# Patient Record
Sex: Female | Born: 1970 | Race: White | Hispanic: No | Marital: Single | Smoking: Former smoker
Health system: Southern US, Community
[De-identification: ages and names within clinical notes are randomized; demographics above are authoritative.]

## PROBLEM LIST (undated history)

## (undated) DIAGNOSIS — K602 Anal fissure, unspecified: Secondary | ICD-10-CM

## (undated) DIAGNOSIS — N920 Excessive and frequent menstruation with regular cycle: Secondary | ICD-10-CM

## (undated) DIAGNOSIS — N83209 Unspecified ovarian cyst, unspecified side: Secondary | ICD-10-CM

## (undated) HISTORY — DX: Anal fissure, unspecified: K60.2

## (undated) HISTORY — DX: Unspecified ovarian cyst, unspecified side: N83.209

## (undated) HISTORY — DX: Excessive and frequent menstruation with regular cycle: N92.0

---

## 1998-06-06 HISTORY — PX: AUGMENTATION MAMMAPLASTY: SUR837

## 2001-06-06 HISTORY — PX: ABDOMINAL HYSTERECTOMY: SHX81

## 2013-11-08 ENCOUNTER — Ambulatory Visit: Payer: Self-pay | Admitting: Obstetrics and Gynecology

## 2013-11-13 ENCOUNTER — Ambulatory Visit: Payer: Self-pay | Admitting: Obstetrics and Gynecology

## 2017-12-15 ENCOUNTER — Ambulatory Visit: Payer: Self-pay | Admitting: Obstetrics and Gynecology

## 2017-12-15 ENCOUNTER — Encounter: Payer: Self-pay | Admitting: Obstetrics and Gynecology

## 2017-12-15 VITALS — BP 102/69 | HR 97 | Ht 63.0 in | Wt 162.1 lb

## 2017-12-15 DIAGNOSIS — K649 Unspecified hemorrhoids: Secondary | ICD-10-CM

## 2017-12-15 MED ORDER — HYDROCORTISONE ACETATE 25 MG RE SUPP: 25.0000 mg | Freq: Two times a day (BID) | RECTAL | 0 refills | Status: DC

## 2017-12-15 NOTE — Progress Notes (Signed)
HPI:      [name] isHorald Ellis a 47 y.o. G1P1001 who LMP was No LMP recorded. Patient has had a hysterectomy.  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  She states that last night she had severe pain around her anus that she could not resolve.  This morning she reports that it is significantly improved.  She is not sure if it is an abscess or bad fissure or hemorrhoid. She does report a history of previously thrombosed hemorrhoids as well as anal fissures which she treats regularly keeping her stool soft, using sitz bath's and a variety of topical anesthetics.    Hx: The following portions of the patient's history were reviewed and updated as appropriate:             She  has a past medical history of Anal fissure, Heavy periods, and Ovarian cyst. She does not have a problem list on file. She  has a past surgical history that includes Abdominal hysterectomy (2003). Her family history includes Gallbladder disease in her sister. She  reports that she has been smoking.  She has been smoking about 0.50 packs per day. She has never used smokeless tobacco. She reports that she drinks alcohol. She reports that she has current or past drug history. She has a current medication list which includes the following prescription(s): acetaminophen, ibuprofen, lidocaine, and hydrocortisone. She has No Known Allergies.       Review of Systems:  Review of Systems  Constitutional: Denied constitutional symptoms, night sweats, recent illness, fatigue, fever, insomnia and weight loss.  Eyes: Denied eye symptoms, eye pain, photophobia, vision change and visual disturbance.  Ears/Nose/Throat/Neck: Denied ear, nose, throat or neck symptoms, hearing loss, nasal discharge, sinus congestion and sore throat.  Cardiovascular: Denied cardiovascular symptoms, arrhythmia, chest pain/pressure, edema, exercise intolerance, orthopnea and palpitations.  Respiratory: Denied  pulmonary symptoms, asthma, pleuritic pain, productive sputum, cough, dyspnea and wheezing.  Gastrointestinal: Denied, gastro-esophageal reflux, melena, nausea and vomiting.  Genitourinary: See HPI for additional information.  Musculoskeletal: Denied musculoskeletal symptoms, stiffness, swelling, muscle weakness and myalgia.  Dermatologic: Denied dermatology symptoms, rash and scar.  Neurologic: Denied neurology symptoms, dizziness, headache, neck pain and syncope.  Psychiatric: Denied psychiatric symptoms, anxiety and depression.  Endocrine: Denied endocrine symptoms including hot flashes and night sweats.   Meds:   Current Outpatient Medications on File Prior to Visit  Medication Sig Dispense Refill  . acetaminophen (TYLENOL) 325 MG tablet Take 650 mg by mouth every 6 (six) hours as needed.    Marland Kitchen. ibuprofen (ADVIL,MOTRIN) 600 MG tablet Take by mouth.    . lidocaine (XYLOCAINE) 5 % ointment Apply 1 application topically as needed.     No current facility-administered medications on file prior to visit.     Objective:     Vitals:   12/15/17 1034  BP: 102/69  Pulse: 97              Examination of the anus and anal verge reveals 2 hemorrhoidal tags.  These are not tense.  They are nontender.  There is no erythema consistent with infection.  No anal fissures could be detected.  Assessment:    G1P1001 There are no active problems to display for this patient.    1. Hemorrhoids, unspecified hemorrhoid type     Nonthrombosed and much improved today versus yesterday.  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   Plan:            1.  Anusol HC suppositories prescribed in case she has a flare this weekend.  2.  Refer to GI for further evaluation and possible definitive treatment. Orders Orders Placed This Encounter  Procedures  . Ambulatory referral to Gastroenterology     Meds ordered this  encounter  Medications  . hydrocortisone (ANUSOL-HC) 25 MG suppository    Sig: Place 1 suppository (25 mg total) rectally 2 (two) times daily.    Dispense:  12 suppository    Refill:  0      F/U  No follow-ups on file. I spent 25 minutes involved in the care of this patient of which greater than 50% was spent discussing history of hemorrhoids fissures etc. going back to the 90s and previous childbirth.  Discussion of current condition and strategies that she has used to try to manage the pain.  Discussed use of suppositories and possible referral to GI.  All questions answered.  Elonda Husky, M.D. 12/15/2017 12:38 PM

## 2018-01-10 ENCOUNTER — Ambulatory Visit: Payer: Self-pay | Admitting: Gastroenterology

## 2019-08-20 ENCOUNTER — Other Ambulatory Visit: Payer: Self-pay | Admitting: Family Medicine

## 2019-08-20 DIAGNOSIS — Z1231 Encounter for screening mammogram for malignant neoplasm of breast: Secondary | ICD-10-CM

## 2019-08-26 ENCOUNTER — Ambulatory Visit: Payer: 59 | Admitting: Nurse Practitioner

## 2019-09-18 ENCOUNTER — Other Ambulatory Visit: Payer: Self-pay | Admitting: Family Medicine

## 2019-09-18 ENCOUNTER — Ambulatory Visit
Admission: RE | Admit: 2019-09-18 | Discharge: 2019-09-18 | Disposition: A | Discharge status: Home / Self Care | Payer: 59 | Source: Ambulatory Visit | Attending: Family Medicine | Admitting: Family Medicine

## 2019-09-18 DIAGNOSIS — Z1231 Encounter for screening mammogram for malignant neoplasm of breast: Secondary | ICD-10-CM

## 2020-08-20 ENCOUNTER — Other Ambulatory Visit: Payer: Self-pay | Admitting: Family Medicine

## 2020-08-20 DIAGNOSIS — Z1231 Encounter for screening mammogram for malignant neoplasm of breast: Secondary | ICD-10-CM

## 2020-11-12 ENCOUNTER — Ambulatory Visit
Admission: RE | Admit: 2020-11-12 | Discharge: 2020-11-12 | Disposition: A | Discharge status: Home / Self Care | Payer: 59 | Source: Ambulatory Visit | Attending: Family Medicine | Admitting: Family Medicine

## 2020-11-12 DIAGNOSIS — Z1231 Encounter for screening mammogram for malignant neoplasm of breast: Secondary | ICD-10-CM | POA: Diagnosis not present

## 2021-09-20 IMAGING — MG DIGITAL SCREENING BREAST BILAT IMPLANT W/ TOMO W/ CAD
9 of 14 series · 9 of 34 positions shown · non-contrast
Comparison: Previous exam(s).

CLINICAL DATA: Screening.

EXAM:
DIGITAL SCREENING BILATERAL MAMMOGRAM WITH IMPLANTS, CAD AND
TOMOSYNTHESIS
TECHNIQUE: Bilateral screening digital craniocaudal and mediolateral oblique
mammograms were obtained. Bilateral screening digital breast
tomosynthesis was performed. The images were evaluated with
computer-aided detection. Standard and/or implant displaced views
were performed.

[L CC]
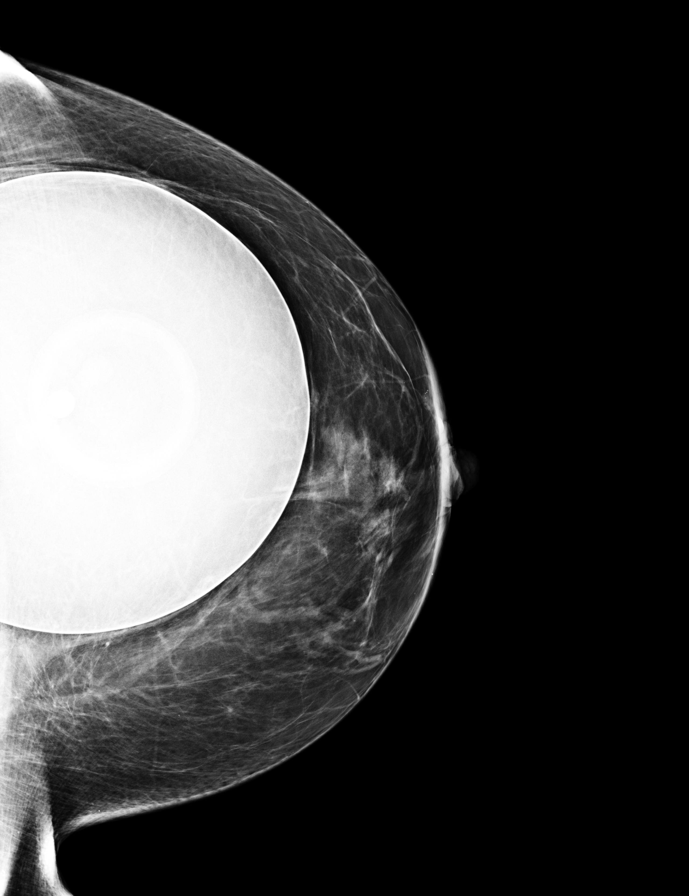

[L MLO]
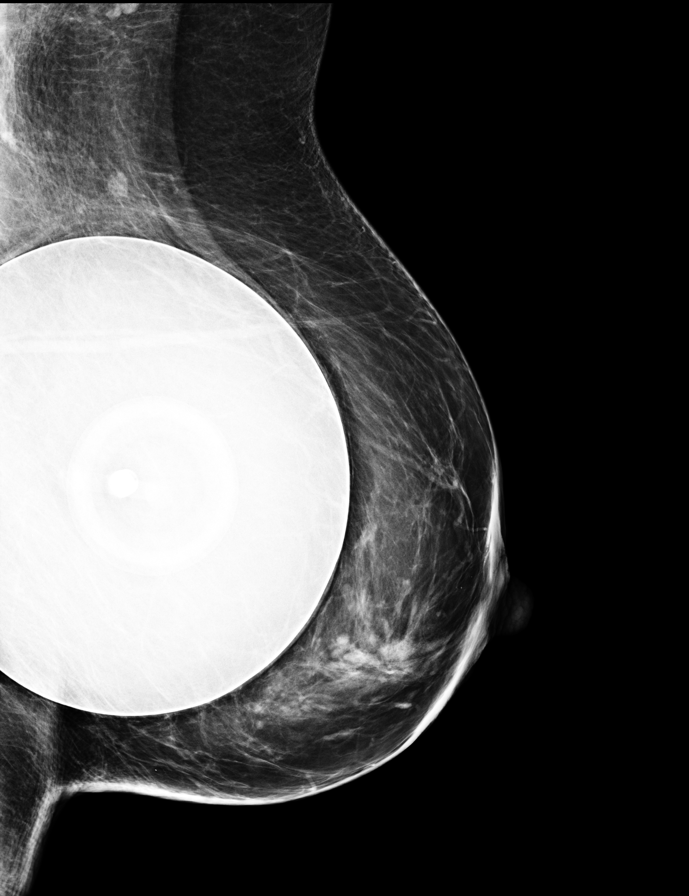

[R MLO]
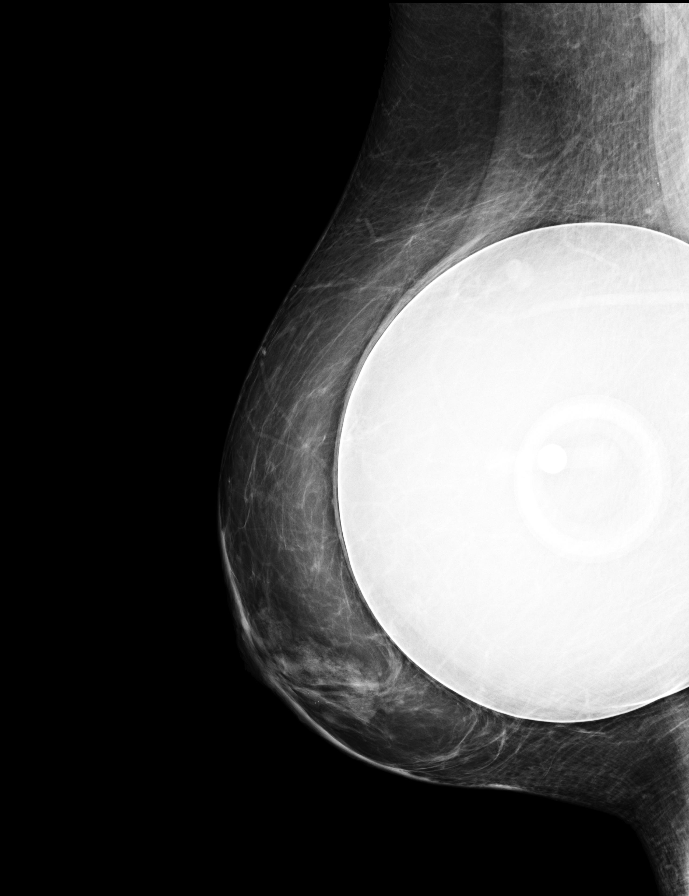

[R CC]
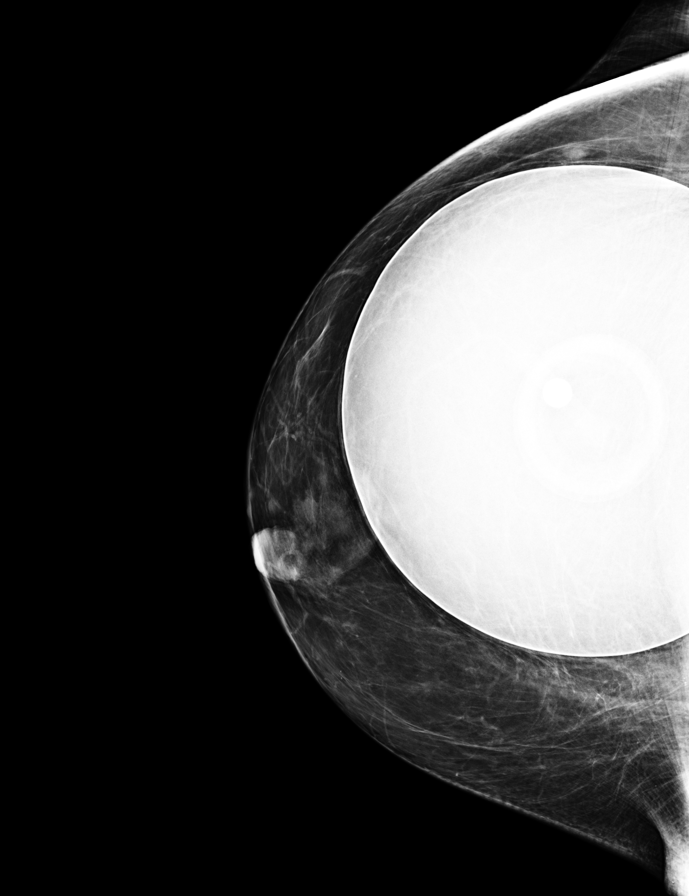

[R CC synth-2D]
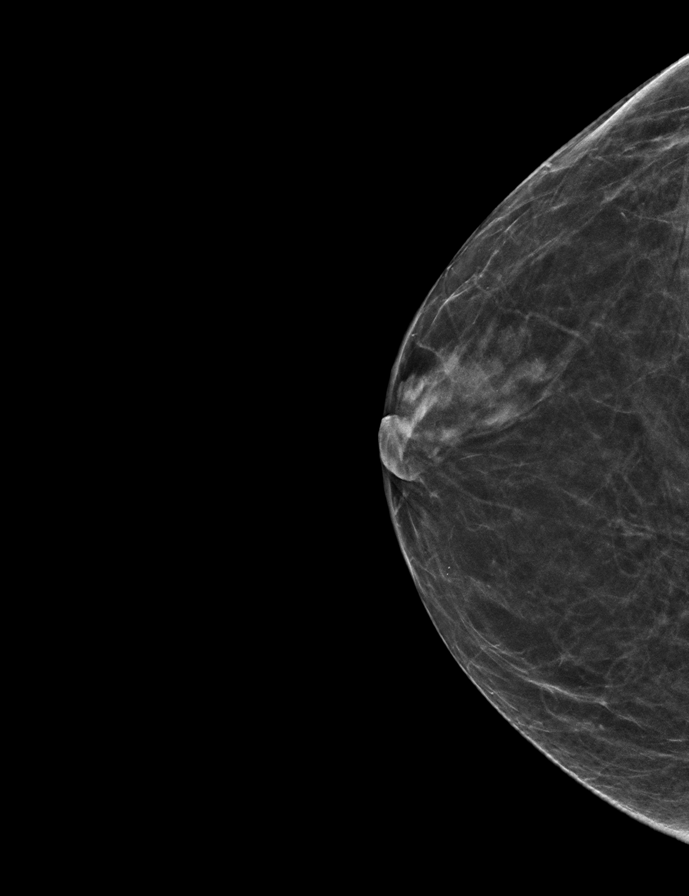

[L MLO synth-2D (1 of 2)]
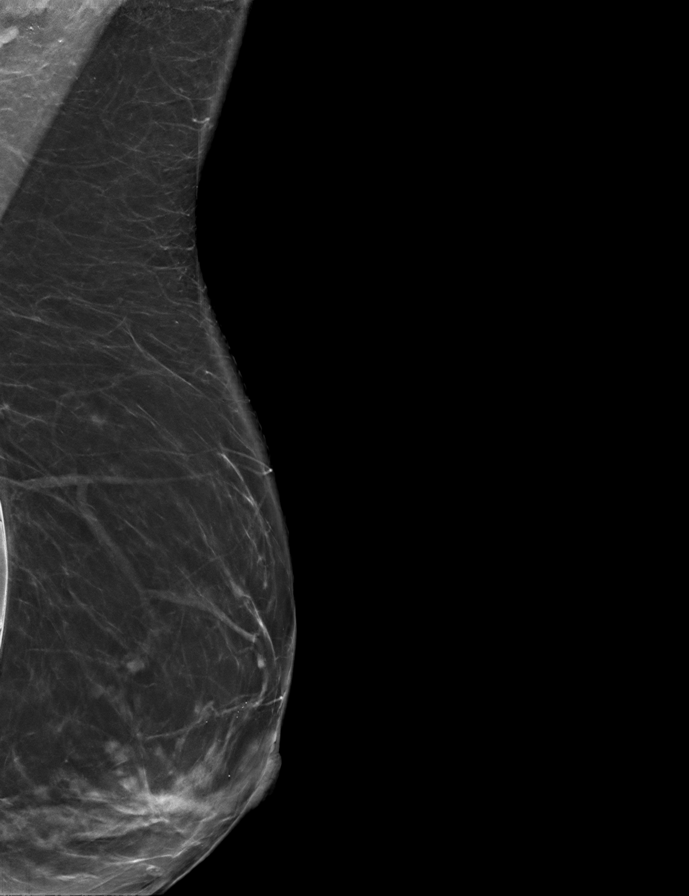

[L CC synth-2D]
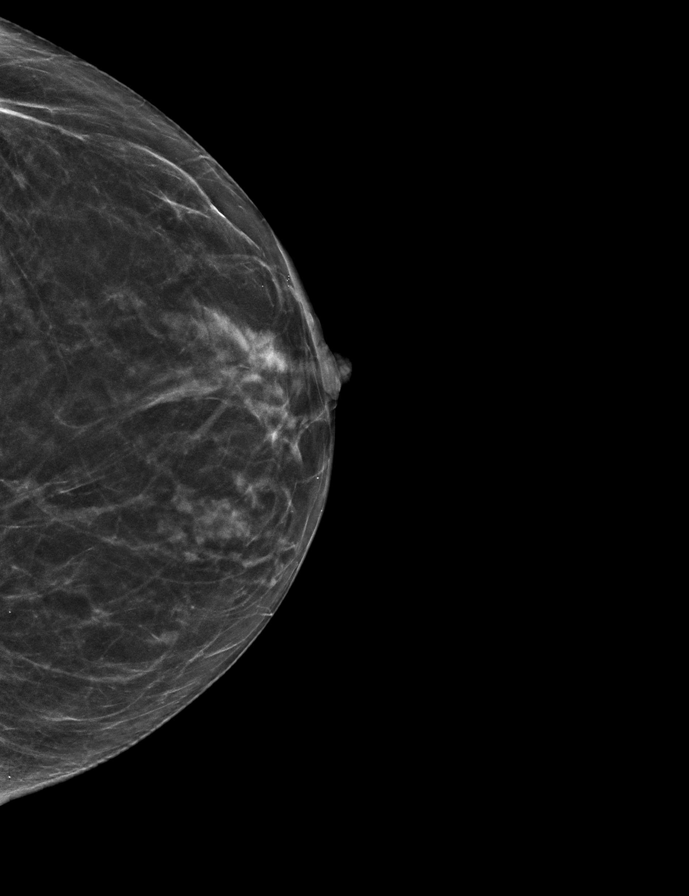

[R MLO synth-2D]
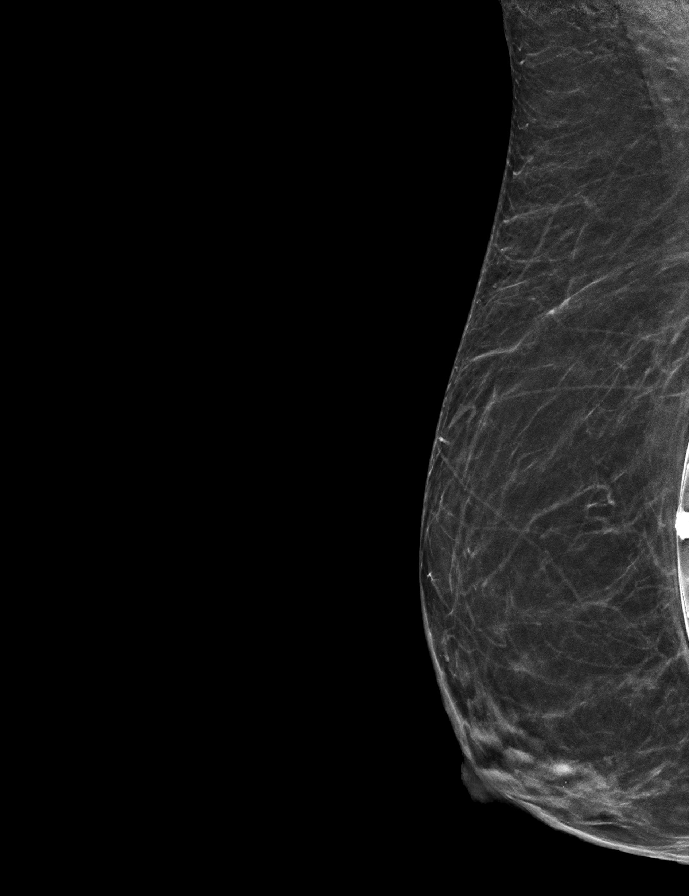

[L MLO synth-2D (2 of 2)]
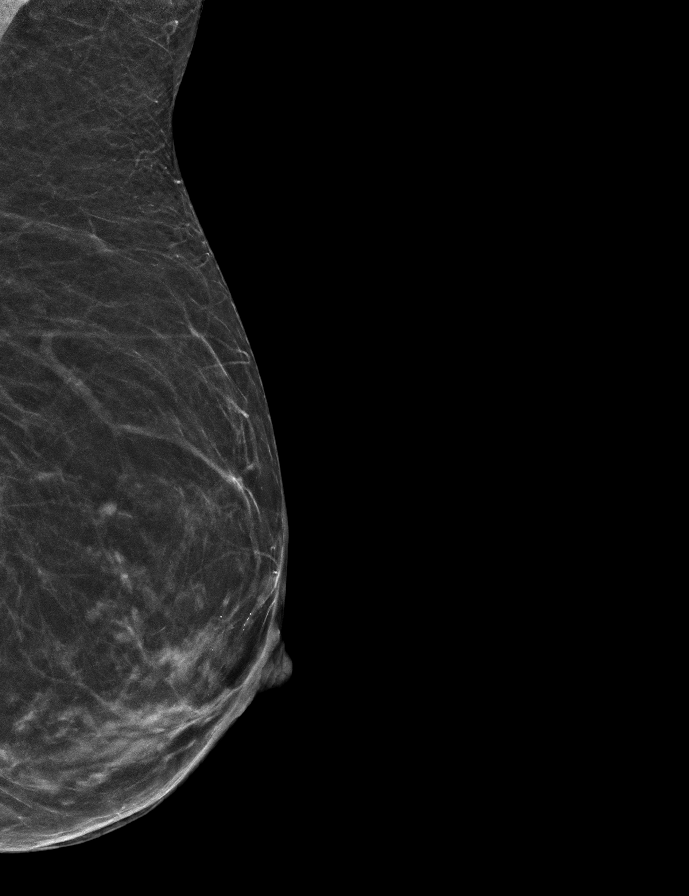

[9 of 34 positions shown; findings below may reference images not displayed]

ACR Breast Density Category b: There are scattered areas of
fibroglandular density.
FINDINGS: The patient has retropectoral implants. There are no findings
suspicious for malignancy.
IMPRESSION: No mammographic evidence of malignancy. A result letter of this
screening mammogram will be mailed directly to the patient.

RECOMMENDATION:
Screening mammogram in one year. (Code:SE-S-JMG)

BI-RADS CATEGORY  1:  Negative.

## 2022-06-15 ENCOUNTER — Other Ambulatory Visit (INDEPENDENT_AMBULATORY_CARE_PROVIDER_SITE_OTHER): Payer: Self-pay | Admitting: Vascular Surgery

## 2022-06-15 DIAGNOSIS — G4484 Primary exertional headache: Secondary | ICD-10-CM

## 2022-06-17 ENCOUNTER — Ambulatory Visit (INDEPENDENT_AMBULATORY_CARE_PROVIDER_SITE_OTHER): Payer: Commercial Managed Care - PPO | Admitting: Vascular Surgery

## 2022-06-17 ENCOUNTER — Encounter (INDEPENDENT_AMBULATORY_CARE_PROVIDER_SITE_OTHER): Payer: Self-pay | Admitting: Vascular Surgery

## 2022-06-17 ENCOUNTER — Ambulatory Visit (INDEPENDENT_AMBULATORY_CARE_PROVIDER_SITE_OTHER): Payer: Commercial Managed Care - PPO

## 2022-06-17 VITALS — BP 117/82 | HR 65 | Ht 63.0 in | Wt 162.0 lb

## 2022-06-17 DIAGNOSIS — G4484 Primary exertional headache: Secondary | ICD-10-CM

## 2022-06-17 DIAGNOSIS — F17200 Nicotine dependence, unspecified, uncomplicated: Secondary | ICD-10-CM | POA: Diagnosis not present

## 2022-06-17 DIAGNOSIS — I6521 Occlusion and stenosis of right carotid artery: Secondary | ICD-10-CM

## 2022-06-17 DIAGNOSIS — I6529 Occlusion and stenosis of unspecified carotid artery: Secondary | ICD-10-CM | POA: Insufficient documentation

## 2022-06-17 NOTE — Assessment & Plan Note (Signed)
Patient says she recently quit and was congratulated on smoking cessation.

## 2022-06-17 NOTE — Assessment & Plan Note (Signed)
A carotid duplex was performed today which showed plaque in the right carotid artery with disease that would fall in the 1 to 39% range although likely the lower end of that range.  No significant left carotid disease was seen.  Both vertebral arteries were antegrade. This was unlikely the cause of her recent symptoms.  No intervention is required for this.  This can be checked every 2 years or so with noninvasive studies at this point.

## 2022-06-17 NOTE — Progress Notes (Signed)
Patient ID: Eileen Ellis, female   DOB: May 02, 1971, 52 y.o.   MRN: [id number]  Chief Complaint  Patient presents with   New Patient (Initial Visit)    np. consult. 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 recent carotid in epic. referred by shah, hemang      HPI Eileen Ellis is a 52 y.o. female.  I am asked to see the patient by Lenise Arena for evaluation of headaches, tachycardia, and presyncope.  There was concern for possible carotid disease in a patient with a long history of tobacco use who just quit last month.  She did not have focal arm or leg weakness or numbness, speech or swallowing difficulty, or temporary monocular blindness.  A carotid duplex was performed today which showed plaque in the right carotid artery with disease that would fall in the 1 to 39% range although likely the lower end of that range.  No significant left carotid disease was seen.  Both vertebral arteries were antegrade.   Past Medical History:  Diagnosis Date   Anal fissure    Heavy periods    Ovarian cyst     Past Surgical History:  Procedure Laterality Date   ABDOMINAL HYSTERECTOMY  2003   Dr. Davis Gourd   AUGMENTATION MAMMAPLASTY Bilateral 2000     Family History  Problem Relation Age of Onset   Gallbladder disease Sister    Breast cancer Neg Hx    Ovarian cancer Neg Hx    Colon cancer Neg Hx    Diabetes Neg Hx       Social History   Tobacco Use   Smoking status: Former    Packs/day: 0.50    Types: Cigarettes    Start date: 06/07/1991    Quit date: 05/08/2022    Years since quitting: 0.1   Smokeless tobacco: Never  Vaping Use   Vaping Use: Never used  Substance Use Topics   Alcohol use: Yes    Comment: occas   Drug use: Not Currently  Patient says she stopped smoking about a month ago.   No Known Allergies  Current Outpatient Medications  Medication Sig Dispense Refill   BLACK CURRANT SEED OIL PO Take by mouth.     Multiple Vitamins-Minerals (ZINC  PO) Take by mouth.     NON FORMULARY Thyroxal     NON FORMULARY QUERCTIN C     Omega-3 Fatty Acids (FISH OIL PO) Take by mouth.     buPROPion (WELLBUTRIN SR) 150 MG 12 hr tablet Take 150 mg by mouth 2 (two) times daily. (Patient not taking: Reported on 06/17/2022)     No current facility-administered medications for this visit.      REVIEW OF SYSTEMS (Negative unless checked)  Constitutional: [] Weight loss  [] Fever  [] Chills Cardiac: [] Chest pain   [] Chest pressure   [x] Palpitations   [] Shortness of breath when laying flat   [] Shortness of breath at rest   [] Shortness of breath with exertion. Vascular:  [] Pain in legs with walking   [] Pain in legs at rest   [] Pain in legs when laying flat   [] Claudication   [] Pain in feet when walking  [] Pain in feet at rest  [] Pain in feet when laying flat   [] History of DVT   [] Phlebitis   [] Swelling in legs   [] Varicose veins   [] Non-healing ulcers Pulmonary:   [] Uses home oxygen   [] Productive cough   [] Hemoptysis   [] Wheeze  [] COPD   [] Asthma Neurologic:  [] Dizziness  []   Blackouts   [] Seizures   [] History of stroke   [] History of TIA  [] Aphasia   [] Temporary blindness   [] Dysphagia   [] Weakness or numbness in arms   [] Weakness or numbness in legs X headache Musculoskeletal:  [] Arthritis   [] Joint swelling   [] Joint pain   [] Low back pain Hematologic:  [] Easy bruising  [] Easy bleeding   [] Hypercoagulable state   [] Anemic  [] Hepatitis Gastrointestinal:  [] Blood in stool   [] Vomiting blood  [] Gastroesophageal reflux/heartburn   [x] Abdominal pain Genitourinary:  [] Chronic kidney disease   [] Difficult urination  [] Frequent urination  [] Burning with urination   [] Hematuria Skin:  [] Rashes   [] Ulcers   [] Wounds Psychological:  [] History of anxiety   []  History of major depression.    Physical Exam BP 117/82   Pulse 65   Ht 5\' 3"  (1.6 m)   Wt 162 lb (73.5 kg)   BMI 28.70 kg/m  Gen:  WD/WN, NAD Head: [place]/AT, No temporalis wasting. Ear/Nose/Throat:  Hearing grossly intact, nares w/o erythema or drainage, oropharynx w/o Erythema/Exudate Eyes: Conjunctiva clear, sclera non-icteric  Neck: trachea midline.  No JVD.  Pulmonary:  Good air movement, respirations not labored, no use of accessory muscles  Cardiac: RRR, no JVD Vascular:  Vessel Right Left  Radial Palpable Palpable                                   Gastrointestinal:. No masses, surgical incisions, or scars. Musculoskeletal: M/S 5/5 throughout.  Extremities without ischemic changes.  No deformity or atrophy. No edema. Neurologic: Sensation grossly intact in extremities.  Symmetrical.  Speech is fluent. Motor exam as listed above. Psychiatric: Judgment intact, Mood & affect appropriate for pt's clinical situation. Dermatologic: No rashes or ulcers noted.  No cellulitis or open wounds.    Radiology No results found.  Labs No results found for this or any previous visit (from the past 2160 hour(s)).  Assessment/Plan:  Carotid stenosis A carotid duplex was performed today which showed plaque in the right carotid artery with disease that would fall in the 1 to 39% range although likely the lower end of that range.  No significant left carotid disease was seen.  Both vertebral arteries were antegrade. This was unlikely the cause of her recent symptoms.  No intervention is required for this.  This can be checked every 2 years or so with noninvasive studies at this point.  Tobacco use disorder Patient says she recently quit and was congratulated on smoking cessation.      Leotis Pain 06/17/2022, 11:18 AM   This note was created with Dragon medical transcription system.  Any errors from dictation are unintentional.

## 2022-11-02 ENCOUNTER — Encounter: Payer: Self-pay | Admitting: Family

## 2022-11-02 ENCOUNTER — Ambulatory Visit (INDEPENDENT_AMBULATORY_CARE_PROVIDER_SITE_OTHER): Payer: Commercial Managed Care - PPO | Admitting: Family

## 2022-11-02 ENCOUNTER — Ambulatory Visit: Payer: Commercial Managed Care - PPO | Admitting: Family

## 2022-11-02 VITALS — BP 130/86 | HR 70 | Ht 63.0 in | Wt 186.8 lb

## 2022-11-02 DIAGNOSIS — E559 Vitamin D deficiency, unspecified: Secondary | ICD-10-CM

## 2022-11-02 DIAGNOSIS — I10 Essential (primary) hypertension: Secondary | ICD-10-CM | POA: Diagnosis not present

## 2022-11-02 DIAGNOSIS — E782 Mixed hyperlipidemia: Secondary | ICD-10-CM

## 2022-11-02 DIAGNOSIS — R7303 Prediabetes: Secondary | ICD-10-CM | POA: Diagnosis not present

## 2022-11-02 DIAGNOSIS — E538 Deficiency of other specified B group vitamins: Secondary | ICD-10-CM

## 2022-11-02 DIAGNOSIS — R5383 Other fatigue: Secondary | ICD-10-CM | POA: Diagnosis not present

## 2022-11-02 NOTE — Progress Notes (Signed)
New Patient Office Visit  Subjective    Patient ID: Eileen Ellis, female    DOB: [birth year]  Age: 52 y.o. MRN: [id number]  CC:  Chief Complaint  Patient presents with   Establish Care    NPE    HPI Eileen Ellis presents to establish care Previous Primary Care provider/office:   she does have additional concerns to discuss today.   Last June (2023), doesn't take any RX's, usually vitamins. Smoker - sent her Wellbutrin.  Stopped smoking December 2023.   [employer] in [place].  In June 2023.  Got a program, lost 25 pounds by December.  Going to a trainer twice a week.  Cardio MWF.   Bathroom multiple times per night, every 2-3 hours.       Outpatient Encounter Medications as of 11/02/2022  Medication Sig   BLACK CURRANT SEED OIL PO Take by mouth.   buPROPion (WELLBUTRIN SR) 150 MG 12 hr tablet Take 150 mg by mouth 2 (two) times daily.   Multiple Vitamins-Minerals (ZINC PO) Take by mouth.   NON FORMULARY Thyroxal   NON FORMULARY QUERCTIN C   Omega-3 Fatty Acids (FISH OIL PO) Take by mouth.   No facility-administered encounter medications on file as of 11/02/2022.    Past Medical History:  Diagnosis Date   Anal fissure    Heavy periods    Ovarian cyst     Past Surgical History:  Procedure Laterality Date   ABDOMINAL HYSTERECTOMY  2003   Dr. Barnabas Lister   AUGMENTATION MAMMAPLASTY Bilateral 2000    Family History  Problem Relation Age of Onset   Gallbladder disease Sister    Breast cancer Neg Hx    Ovarian cancer Neg Hx    Colon cancer Neg Hx    Diabetes Neg Hx     Social History   Socioeconomic History   Marital status: Single    Spouse name: Not on file   Number of children: Not on file   Years of education: Not on file   Highest education level: Not on file  Occupational History   Not on file  Tobacco Use   Smoking status: Former    Packs/day: .5    Types: Cigarettes    Start date: 06/07/1991    Quit date: 05/08/2022    Years since  quitting: 0.5   Smokeless tobacco: Never  Vaping Use   Vaping Use: Never used  Substance and Sexual Activity   Alcohol use: Yes    Comment: occas   Drug use: Not Currently   Sexual activity: Yes    Birth control/protection: Surgical  Other Topics Concern   Not on file  Social History Narrative   Not on file   Social Determinants of Health   Financial Resource Strain: Not on file  Food Insecurity: Not on file  Transportation Needs: Not on file  Physical Activity: Not on file  Stress: Not on file  Social Connections: Not on file  Intimate Partner Violence: Not on file    Review of Systems  All other systems reviewed and are negative.       Objective    BP 130/86   Pulse 70   Ht 5\' 3"  (1.6 m)   Wt 186 lb 12.8 oz (84.7 kg)   SpO2 98%   BMI 33.09 kg/m   Physical Exam Vitals and nursing note reviewed.  Constitutional:      Appearance: Normal appearance. She is normal weight.  HENT:  Head: Normocephalic.  Eyes:     Pupils: Pupils are equal, round, and reactive to light.  Cardiovascular:     Rate and Rhythm: Normal rate.  Pulmonary:     Effort: Pulmonary effort is normal.  Neurological:     Mental Status: She is alert.        Assessment & Plan:   Problem List Items Addressed This Visit   None Visit Diagnoses     Prediabetes    -  Primary   Relevant Orders   CBC With Differential (Completed)   CMP14+EGFR (Completed)   Hemoglobin A1c (Completed)   B12 deficiency due to diet       Relevant Orders   CBC With Differential (Completed)   CMP14+EGFR (Completed)   Vitamin B12 (Completed)   Other fatigue       Relevant Orders   CBC With Differential (Completed)   CMP14+EGFR (Completed)   TSH (Completed)   Mixed hyperlipidemia       Relevant Orders   Lipid panel (Completed)   CBC With Differential (Completed)   CMP14+EGFR (Completed)   Vitamin D deficiency, unspecified       Relevant Orders   VITAMIN D 25 Hydroxy (Vit-D Deficiency, Fractures)  (Completed)   CBC With Differential (Completed)   CMP14+EGFR (Completed)   Essential hypertension, benign       Relevant Orders   CBC With Differential (Completed)   CMP14+EGFR (Completed)       Return in about 1 week (around 11/09/2022) for F/U.   Total time spent: 30 minutes  Miki Kins, FNP  11/02/2022  This document may have been prepared by [facility] Voice Recognition software and as such may include unintentional dictation errors.

## 2022-11-03 LAB — CBC WITH DIFFERENTIAL
Basophils Absolute: 0 10*3/uL (ref 0.0–0.2)
Basos: 1 %
EOS (ABSOLUTE): 0.1 10*3/uL (ref 0.0–0.4)
Eos: 1 %
Hematocrit: 39.8 % (ref 34.0–46.6)
Hemoglobin: 13.1 g/dL (ref 11.1–15.9)
Immature Grans (Abs): 0 10*3/uL (ref 0.0–0.1)
Immature Granulocytes: 0 %
Lymphocytes Absolute: 3.9 10*3/uL — ABNORMAL HIGH (ref 0.7–3.1)
Lymphs: 45 %
MCH: 30.4 pg (ref 26.6–33.0)
MCHC: 32.9 g/dL (ref 31.5–35.7)
MCV: 92 fL (ref 79–97)
Monocytes Absolute: 0.6 10*3/uL (ref 0.1–0.9)
Monocytes: 7 %
Neutrophils Absolute: 4.1 10*3/uL (ref 1.4–7.0)
Neutrophils: 46 %
RBC: 4.31 x10E6/uL (ref 3.77–5.28)
RDW: 12.4 % (ref 11.7–15.4)
WBC: 8.7 10*3/uL (ref 3.4–10.8)

## 2022-11-03 LAB — VITAMIN D 25 HYDROXY (VIT D DEFICIENCY, FRACTURES): Vit D, 25-Hydroxy: 47.4 ng/mL (ref 30.0–100.0)

## 2022-11-03 LAB — CMP14+EGFR
ALT: 20 IU/L (ref 0–32)
AST: 21 IU/L (ref 0–40)
Albumin/Globulin Ratio: 1.7 (ref 1.2–2.2)
Albumin: 4.3 g/dL (ref 3.8–4.9)
Alkaline Phosphatase: 75 IU/L (ref 44–121)
BUN/Creatinine Ratio: 13 (ref 9–23)
BUN: 13 mg/dL (ref 6–24)
Bilirubin Total: <0.2 mg/dL (ref 0.0–1.2)
CO2: 23 mmol/L (ref 20–29)
Calcium: 9.5 mg/dL (ref 8.7–10.2)
Chloride: 106 mmol/L (ref 96–106)
Creatinine, Ser: 0.97 mg/dL (ref 0.57–1.00)
Globulin, Total: 2.6 g/dL (ref 1.5–4.5)
Glucose: 70 mg/dL (ref 70–99)
Potassium: 5.2 mmol/L (ref 3.5–5.2)
Sodium: 142 mmol/L (ref 134–144)
Total Protein: 6.9 g/dL (ref 6.0–8.5)
eGFR: 70 mL/min/{1.73_m2} (ref >59)

## 2022-11-03 LAB — LIPID PANEL
Chol/HDL Ratio: 2.9 ratio (ref 0.0–4.4)
Cholesterol, Total: 216 mg/dL — ABNORMAL HIGH (ref 100–199)
HDL: 75 mg/dL (ref >39)
LDL Chol Calc (NIH): 100 mg/dL — ABNORMAL HIGH (ref 0–99)
Triglycerides: 246 mg/dL — ABNORMAL HIGH (ref 0–149)
VLDL Cholesterol Cal: 41 mg/dL — ABNORMAL HIGH (ref 5–40)

## 2022-11-03 LAB — HEMOGLOBIN A1C
Est. average glucose Bld gHb Est-mCnc: 108 mg/dL
Hgb A1c MFr Bld: 5.4 % (ref 4.8–5.6)

## 2022-11-03 LAB — VITAMIN B12: Vitamin B-12: 730 pg/mL (ref 232–1245)

## 2022-11-03 LAB — TSH: TSH: 1.19 u[IU]/mL (ref 0.450–4.500)

## 2022-11-09 ENCOUNTER — Encounter: Payer: Self-pay | Admitting: Family

## 2022-11-09 ENCOUNTER — Ambulatory Visit (INDEPENDENT_AMBULATORY_CARE_PROVIDER_SITE_OTHER): Payer: Commercial Managed Care - PPO | Admitting: Family

## 2022-11-09 VITALS — BP 128/80 | HR 63 | Ht 63.0 in | Wt 189.0 lb

## 2022-11-09 DIAGNOSIS — E782 Mixed hyperlipidemia: Secondary | ICD-10-CM

## 2022-11-09 DIAGNOSIS — Z6833 Body mass index (BMI) 33.0-33.9, adult: Secondary | ICD-10-CM | POA: Diagnosis not present

## 2022-11-09 DIAGNOSIS — E6609 Other obesity due to excess calories: Secondary | ICD-10-CM

## 2022-11-09 NOTE — Progress Notes (Signed)
Established Patient Office Visit  Subjective:  Patient ID: Eileen Ellis, female    DOB: [birth year]  Age: 52 y.o. MRN: [id number]  Chief Complaint  Patient presents with   Follow-up    1 week follow up with lab results    Pt. Here today for 1 week n/p follow up.   Had labs done at that time, so we will review in detail today.  Labs: Cholesterol was elevated, esp. Triglycerides, other labs are WNL.   No other concerns today.     Past Medical History:  Diagnosis Date   Anal fissure    Heavy periods    Ovarian cyst     Past Surgical History:  Procedure Laterality Date   ABDOMINAL HYSTERECTOMY  2003   Dr. Barnabas Lister   AUGMENTATION MAMMAPLASTY Bilateral 2000    Social History   Socioeconomic History   Marital status: Single    Spouse name: Not on file   Number of children: Not on file   Years of education: Not on file   Highest education level: Not on file  Occupational History   Not on file  Tobacco Use   Smoking status: Former    Packs/day: .5    Types: Cigarettes    Start date: 06/07/1991    Quit date: 05/08/2022    Years since quitting: 0.5   Smokeless tobacco: Never  Vaping Use   Vaping Use: Never used  Substance and Sexual Activity   Alcohol use: Yes    Comment: occas   Drug use: Not Currently   Sexual activity: Yes    Birth control/protection: Surgical  Other Topics Concern   Not on file  Social History Narrative   Not on file   Social Determinants of Health   Financial Resource Strain: Not on file  Food Insecurity: Not on file  Transportation Needs: Not on file  Physical Activity: Not on file  Stress: Not on file  Social Connections: Not on file  Intimate Partner Violence: Not on file    Family History  Problem Relation Age of Onset   Gallbladder disease Sister    Breast cancer Neg Hx    Ovarian cancer Neg Hx    Colon cancer Neg Hx    Diabetes Neg Hx     No Known Allergies  Review of Systems  All other systems reviewed and are  negative.      Objective:   BP 128/80   Pulse 63   Ht 5\' 3"  (1.6 m)   Wt 189 lb (85.7 kg)   SpO2 98%   BMI 33.48 kg/m   Vitals:   11/09/22 1016  BP: 128/80  Pulse: 63  Height: 5\' 3"  (1.6 m)  Weight: 189 lb (85.7 kg)  SpO2: 98%  BMI (Calculated): 33.49    Physical Exam Vitals and nursing note reviewed.  Constitutional:      Appearance: Normal appearance. She is normal weight.  HENT:     Head: Normocephalic.  Eyes:     Extraocular Movements: Extraocular movements intact.     Conjunctiva/sclera: Conjunctivae normal.     Pupils: Pupils are equal, round, and reactive to light.  Cardiovascular:     Rate and Rhythm: Normal rate.  Pulmonary:     Effort: Pulmonary effort is normal.  Neurological:     General: No focal deficit present.     Mental Status: She is alert and oriented to person, place, and time.  Psychiatric:        Mood and  Affect: Mood normal.        Behavior: Behavior normal.        Thought Content: Thought content normal.      No results found for any visits on 11/09/22.  Recent Results (from the past 2160 hour(s))  Lipid panel     Status: Abnormal   Collection Time: 11/02/22 11:49 AM  Result Value Ref Range   Cholesterol, Total 216 (H) 100 - 199 mg/dL   Triglycerides 409 (H) 0 - 149 mg/dL   HDL 75 >81 mg/dL   VLDL Cholesterol Cal 41 (H) 5 - 40 mg/dL   LDL Chol Calc (NIH) 191 (H) 0 - 99 mg/dL   Chol/HDL Ratio 2.9 0.0 - 4.4 ratio    Comment:                                   T. Chol/HDL Ratio                                             Men  Women                               1/2 Avg.Risk  3.4    3.3                                   Avg.Risk  5.0    4.4                                2X Avg.Risk  9.6    7.1                                3X Avg.Risk 23.4   11.0   VITAMIN D 25 Hydroxy (Vit-D Deficiency, Fractures)     Status: None   Collection Time: 11/02/22 11:49 AM  Result Value Ref Range   Vit D, 25-Hydroxy 47.4 30.0 - 100.0 ng/mL     Comment: Vitamin D deficiency has been defined by the Institute of Medicine and an Endocrine Society practice guideline as a level of serum 25-OH vitamin D less than 20 ng/mL (1,2). The Endocrine Society went on to further define vitamin D insufficiency as a level between 21 and 29 ng/mL (2). 1. IOM (Institute of Medicine). 2010. Dietary reference    intakes for calcium and D. [place]: The    [employer]. 2. Holick MF, Binkley [place], Bischoff-Ferrari HA, et al.    Evaluation, treatment, and prevention of vitamin D    deficiency: an Endocrine Society clinical practice    guideline. JCEM. 2011 Jul; 96(7):1911-30.   CBC With Differential     Status: Abnormal   Collection Time: 11/02/22 11:49 AM  Result Value Ref Range   WBC 8.7 3.4 - 10.8 x10E3/uL   RBC 4.31 3.77 - 5.28 x10E6/uL   Hemoglobin 13.1 11.1 - 15.9 g/dL   Hematocrit 47.8 29.5 - 46.6 %   MCV 92 79 - 97 fL   MCH 30.4 26.6 - 33.0 pg   MCHC 32.9 31.5 - 35.7 g/dL  RDW 12.4 11.7 - 15.4 %   Neutrophils 46 Not Estab. %   Lymphs 45 Not Estab. %   Monocytes 7 Not Estab. %   Eos 1 Not Estab. %   Basos 1 Not Estab. %   Neutrophils Absolute 4.1 1.4 - 7.0 x10E3/uL   Lymphocytes Absolute 3.9 (H) 0.7 - 3.1 x10E3/uL   Monocytes Absolute 0.6 0.1 - 0.9 x10E3/uL   EOS (ABSOLUTE) 0.1 0.0 - 0.4 x10E3/uL   Basophils Absolute 0.0 0.0 - 0.2 x10E3/uL   Immature Granulocytes 0 Not Estab. %   Immature Grans (Abs) 0.0 0.0 - 0.1 x10E3/uL  CMP14+EGFR     Status: None   Collection Time: 11/02/22 11:49 AM  Result Value Ref Range   Glucose 70 70 - 99 mg/dL   BUN 13 6 - 24 mg/dL   Creatinine, Ser 1.61 0.57 - 1.00 mg/dL   eGFR 70 >09 UE/AVW/0.98   BUN/Creatinine Ratio 13 9 - 23   Sodium 142 134 - 144 mmol/L   Potassium 5.2 3.5 - 5.2 mmol/L   Chloride 106 96 - 106 mmol/L   CO2 23 20 - 29 mmol/L   Calcium 9.5 8.7 - 10.2 mg/dL   Total Protein 6.9 6.0 - 8.5 g/dL   Albumin 4.3 3.8 - 4.9 g/dL   Globulin, Total 2.6 1.5 - 4.5 g/dL    Albumin/Globulin Ratio 1.7 1.2 - 2.2   Bilirubin Total <0.2 0.0 - 1.2 mg/dL   Alkaline Phosphatase 75 44 - 121 IU/L   AST 21 0 - 40 IU/L   ALT 20 0 - 32 IU/L  TSH     Status: None   Collection Time: 11/02/22 11:49 AM  Result Value Ref Range   TSH 1.190 0.450 - 4.500 uIU/mL  Hemoglobin A1c     Status: None   Collection Time: 11/02/22 11:49 AM  Result Value Ref Range   Hgb A1c MFr Bld 5.4 4.8 - 5.6 %    Comment:          Prediabetes: 5.7 - 6.4          Diabetes: >6.4          Glycemic control for adults with diabetes: <7.0    Est. average glucose Bld gHb Est-mCnc 108 mg/dL  Vitamin J19     Status: None   Collection Time: 11/02/22 11:49 AM  Result Value Ref Range   Vitamin B-12 730 232 - 1,245 pg/mL       Assessment & Plan:   Problem List Items Addressed This Visit       Active Problems   Class 1 obesity due to excess calories with serious comorbidity and body mass index (BMI) of 33.0 to 33.9 in adult - Primary    Starting pt on Ozempic.  Will adjust as needed based on results.  The patient is asked to make an attempt to improve diet and exercise patterns to aid in medical management of this problem. Addressed importance of increasing and maintaining water intake.  Instructed pt on proper use of pen-injector, dosing, and administration.  Also educated on side effects.        Return in about 1 month (around 12/09/2022).   Total time spent: 20 minutes  Miki Kins, FNP  11/09/2022   This document may have been prepared by [facility] Voice Recognition software and as such may include unintentional dictation errors.

## 2022-11-16 ENCOUNTER — Encounter: Payer: Self-pay | Admitting: Family

## 2022-11-16 NOTE — Progress Notes (Signed)
Sleep Questionnaire: 7/10  

## 2022-11-20 ENCOUNTER — Encounter: Payer: Self-pay | Admitting: Family

## 2022-11-20 DIAGNOSIS — E782 Mixed hyperlipidemia: Secondary | ICD-10-CM | POA: Insufficient documentation

## 2022-11-20 DIAGNOSIS — E6609 Other obesity due to excess calories: Secondary | ICD-10-CM | POA: Insufficient documentation

## 2022-11-20 NOTE — Assessment & Plan Note (Signed)
Starting pt on Ozempic.  Will adjust as needed based on results.  The patient is asked to make an attempt to improve diet and exercise patterns to aid in medical management of this problem. Addressed importance of increasing and maintaining water intake.  Instructed pt on proper use of pen-injector, dosing, and administration.  Also educated on side effects.

## 2022-12-05 ENCOUNTER — Ambulatory Visit: Payer: Commercial Managed Care - PPO | Admitting: Family

## 2022-12-06 ENCOUNTER — Ambulatory Visit (INDEPENDENT_AMBULATORY_CARE_PROVIDER_SITE_OTHER): Payer: Commercial Managed Care - PPO | Admitting: Family

## 2022-12-06 ENCOUNTER — Encounter: Payer: Self-pay | Admitting: Family

## 2022-12-06 VITALS — BP 120/80 | HR 65 | Ht 63.0 in | Wt 188.4 lb

## 2022-12-06 DIAGNOSIS — Z6833 Body mass index (BMI) 33.0-33.9, adult: Secondary | ICD-10-CM | POA: Diagnosis not present

## 2022-12-06 DIAGNOSIS — E6609 Other obesity due to excess calories: Secondary | ICD-10-CM

## 2022-12-06 DIAGNOSIS — R7303 Prediabetes: Secondary | ICD-10-CM

## 2022-12-06 DIAGNOSIS — E782 Mixed hyperlipidemia: Secondary | ICD-10-CM | POA: Diagnosis not present

## 2022-12-18 ENCOUNTER — Encounter: Payer: Self-pay | Admitting: Family

## 2022-12-18 NOTE — Progress Notes (Signed)
Established Patient Office Visit  Subjective:  Patient ID: Eileen Ellis, female    DOB: 1971-06-03  Age: 52 y.o. MRN: [id number]  Chief Complaint  Patient presents with   Follow-up    1 month follow up    Patient is here today for her 1 month follow up.  She has been feeling well since last appointment.   She does not have additional concerns to discuss today.  Labs are not due today. She needs refills.   I have reviewed her active problem list, medication list, allergies, notes from last encounter, lab results for her appointment today.    No other concerns at this time.   Past Medical History:  Diagnosis Date   Anal fissure    Heavy periods    Ovarian cyst     Past Surgical History:  Procedure Laterality Date   ABDOMINAL HYSTERECTOMY  2003   Dr. Barnabas Lister   AUGMENTATION MAMMAPLASTY Bilateral 2000    Social History   Socioeconomic History   Marital status: Single    Spouse name: Not on file   Number of children: Not on file   Years of education: Not on file   Highest education level: Not on file  Occupational History   Not on file  Tobacco Use   Smoking status: Former    Current packs/day: 0.00    Average packs/day: 0.5 packs/day for 30.9 years (15.5 ttl pk-yrs)    Types: Cigarettes    Start date: 06/07/1991    Quit date: 05/08/2022    Years since quitting: 0.6   Smokeless tobacco: Never  Vaping Use   Vaping status: Never Used  Substance and Sexual Activity   Alcohol use: Yes    Comment: occas   Drug use: Not Currently   Sexual activity: Yes    Birth control/protection: Surgical  Other Topics Concern   Not on file  Social History Narrative   Not on file   Social Determinants of Health   Financial Resource Strain: Low Risk  (08/19/2019)   Received from [facility], [facility]   Overall Financial Resource Strain (CARDIA)    Difficulty of Paying Living Expenses: Not hard at all  Food Insecurity: No Food  Insecurity (08/19/2019)   Received from [facility], [facility]   Hunger Vital Sign    Worried About Running Out of Food in the Last Year: Never true    Ran Out of Food in the Last Year: Never true  Transportation Needs: No Transportation Needs (08/19/2019)   Received from [facility], [facility]   [facility] - Transportation    In the past 12 months, has lack of transportation kept you from medical appointments or from getting medications?: No    Lack of Transportation (Non-Medical): No  Physical Activity: Insufficiently Active (08/19/2019)   Received from [facility], [facility]   Exercise Vital Sign    Days of Exercise per Week: 3 days    Minutes of Exercise per Session: 30 min  Stress: No Stress Concern Present (08/19/2019)   Received from [facility], [facility]   [employer] of Occupational Health - Occupational Stress Questionnaire    Feeling of Stress : Only a little  Social Connections: Unknown (08/19/2019)   Received from [facility], [facility]   Social Connection and Isolation Panel [NHANES]    Frequency of Communication  with Friends and Family: More than three times a week    Frequency of Social Gatherings with Friends and Family: Twice a week    Attends Religious Services: More than 4 times per year    Active Member of [employer] or Organizations: No    Attends Engineer, structural: Never    Marital Status: Not on file  Intimate Partner Violence: Not on file    Family History  Problem Relation Age of Onset   Gallbladder disease Sister    Breast cancer Neg Hx    Ovarian cancer Neg Hx    Colon cancer Neg Hx    Diabetes Neg Hx     No Known Allergies  Review of Systems  All other systems reviewed and are negative.      Objective:   BP 120/80   Pulse 65   Ht 5\' 3"  (1.6 m)    Wt 188 lb 6.4 oz (85.5 kg)   SpO2 96%   BMI 33.37 kg/m   Vitals:   12/06/22 1121  BP: 120/80  Pulse: 65  Height: 5\' 3"  (1.6 m)  Weight: 188 lb 6.4 oz (85.5 kg)  SpO2: 96%  BMI (Calculated): 33.38    Physical Exam Vitals and nursing note reviewed.  Constitutional:      Appearance: Normal appearance. She is normal weight.  HENT:     Head: Normocephalic.  Eyes:     Extraocular Movements: Extraocular movements intact.     Conjunctiva/sclera: Conjunctivae normal.     Pupils: Pupils are equal, round, and reactive to light.  Cardiovascular:     Rate and Rhythm: Normal rate.  Pulmonary:     Effort: Pulmonary effort is normal.  Neurological:     General: No focal deficit present.     Mental Status: She is alert and oriented to person, place, and time. Mental status is at baseline.  Psychiatric:        Mood and Affect: Mood normal.        Behavior: Behavior normal.        Thought Content: Thought content normal.        Judgment: Judgment normal.      No results found for any visits on 12/06/22.  Recent Results (from the past 2160 hour(s))  Lipid panel     Status: Abnormal   Collection Time: 11/02/22 11:49 AM  Result Value Ref Range   Cholesterol, Total 216 (H) 100 - 199 mg/dL   Triglycerides 161 (H) 0 - 149 mg/dL   HDL 75 >09 mg/dL   VLDL Cholesterol Cal 41 (H) 5 - 40 mg/dL   LDL Chol Calc (NIH) 604 (H) 0 - 99 mg/dL   Chol/HDL Ratio 2.9 0.0 - 4.4 ratio    Comment:                                   T. Chol/HDL Ratio                                             Men  Women                               1/2 Avg.Risk  3.4    3.3  Avg.Risk  5.0    4.4                                2X Avg.Risk  9.6    7.1                                3X Avg.Risk 23.4   11.0   VITAMIN D 25 Hydroxy (Vit-D Deficiency, Fractures)     Status: None   Collection Time: 11/02/22 11:49 AM  Result Value Ref Range   Vit D, 25-Hydroxy 47.4 30.0 - 100.0 ng/mL     Comment: Vitamin D deficiency has been defined by the Institute of Medicine and an Endocrine Society practice guideline as a level of serum 25-OH vitamin D less than 20 ng/mL (1,2). The Endocrine Society went on to further define vitamin D insufficiency as a level between 21 and 29 ng/mL (2). 1. IOM (Institute of Medicine). 2010. Dietary reference    intakes for calcium and D. [place]: The    [employer]. 2. Holick MF, Binkley [place], Bischoff-Ferrari HA, et al.    Evaluation, treatment, and prevention of vitamin D    deficiency: an Endocrine Society clinical practice    guideline. JCEM. 2011 Jul; 96(7):1911-30.   CBC With Differential     Status: Abnormal   Collection Time: 11/02/22 11:49 AM  Result Value Ref Range   WBC 8.7 3.4 - 10.8 x10E3/uL   RBC 4.31 3.77 - 5.28 x10E6/uL   Hemoglobin 13.1 11.1 - 15.9 g/dL   Hematocrit 16.1 09.6 - 46.6 %   MCV 92 79 - 97 fL   MCH 30.4 26.6 - 33.0 pg   MCHC 32.9 31.5 - 35.7 g/dL   RDW 04.5 40.9 - 81.1 %   Neutrophils 46 Not Estab. %   Lymphs 45 Not Estab. %   Monocytes 7 Not Estab. %   Eos 1 Not Estab. %   Basos 1 Not Estab. %   Neutrophils Absolute 4.1 1.4 - 7.0 x10E3/uL   Lymphocytes Absolute 3.9 (H) 0.7 - 3.1 x10E3/uL   Monocytes Absolute 0.6 0.1 - 0.9 x10E3/uL   EOS (ABSOLUTE) 0.1 0.0 - 0.4 x10E3/uL   Basophils Absolute 0.0 0.0 - 0.2 x10E3/uL   Immature Granulocytes 0 Not Estab. %   Immature Grans (Abs) 0.0 0.0 - 0.1 x10E3/uL  CMP14+EGFR     Status: None   Collection Time: 11/02/22 11:49 AM  Result Value Ref Range   Glucose 70 70 - 99 mg/dL   BUN 13 6 - 24 mg/dL   Creatinine, Ser 9.14 0.57 - 1.00 mg/dL   eGFR 70 >78 GN/FAO/1.30   BUN/Creatinine Ratio 13 9 - 23   Sodium 142 134 - 144 mmol/L   Potassium 5.2 3.5 - 5.2 mmol/L   Chloride 106 96 - 106 mmol/L   CO2 23 20 - 29 mmol/L   Calcium 9.5 8.7 - 10.2 mg/dL   Total Protein 6.9 6.0 - 8.5 g/dL   Albumin 4.3 3.8 - 4.9 g/dL   Globulin, Total 2.6 1.5 - 4.5 g/dL    Albumin/Globulin Ratio 1.7 1.2 - 2.2   Bilirubin Total <0.2 0.0 - 1.2 mg/dL   Alkaline Phosphatase 75 44 - 121 IU/L   AST 21 0 - 40 IU/L   ALT 20 0 - 32 IU/L  TSH     Status: None  Collection Time: 11/02/22 11:49 AM  Result Value Ref Range   TSH 1.190 0.450 - 4.500 uIU/mL  Hemoglobin A1c     Status: None   Collection Time: 11/02/22 11:49 AM  Result Value Ref Range   Hgb A1c MFr Bld 5.4 4.8 - 5.6 %    Comment:          Prediabetes: 5.7 - 6.4          Diabetes: >6.4          Glycemic control for adults with diabetes: <7.0    Est. average glucose Bld gHb Est-mCnc 108 mg/dL  Vitamin N82     Status: None   Collection Time: 11/02/22 11:49 AM  Result Value Ref Range   Vitamin B-12 730 232 - 1,245 pg/mL       Assessment & Plan:   Problem List Items Addressed This Visit       Active Problems   Class 1 obesity due to excess calories with serious comorbidity and body mass index (BMI) of 33.0 to 33.9 in adult - Primary    Continue current meds.  Will adjust as needed based on results.  The patient is asked to make an attempt to improve diet and exercise patterns to aid in medical management of this problem. Addressed importance of increasing and maintaining water intake.       Hyperlipidemia, mixed    Patient does not wish to start statin therapy, despite continued elevated cholesterol.   Educated on dietary changes and will recheck at follow up.          Other Visit Diagnoses     Prediabetes           Return in about 1 month (around 01/06/2023) for F/U.   Total time spent: 20 minutes  Miki Kins, FNP  12/06/2022   This document may have been prepared by [facility] Voice Recognition software and as such may include unintentional dictation errors.

## 2022-12-18 NOTE — Assessment & Plan Note (Signed)
Continue current meds.  Will adjust as needed based on results.  The patient is asked to make an attempt to improve diet and exercise patterns to aid in medical management of this problem. Addressed importance of increasing and maintaining water intake.   

## 2022-12-18 NOTE — Assessment & Plan Note (Signed)
Patient does not wish to start statin therapy, despite continued elevated cholesterol.   Educated on dietary changes and will recheck at follow up.

## 2023-06-20 ENCOUNTER — Other Ambulatory Visit (INDEPENDENT_AMBULATORY_CARE_PROVIDER_SITE_OTHER): Payer: Self-pay | Admitting: Vascular Surgery

## 2023-06-20 DIAGNOSIS — I6521 Occlusion and stenosis of right carotid artery: Secondary | ICD-10-CM

## 2023-06-23 ENCOUNTER — Ambulatory Visit (INDEPENDENT_AMBULATORY_CARE_PROVIDER_SITE_OTHER): Payer: Commercial Managed Care - PPO | Admitting: Vascular Surgery

## 2023-06-23 ENCOUNTER — Encounter (INDEPENDENT_AMBULATORY_CARE_PROVIDER_SITE_OTHER): Payer: Commercial Managed Care - PPO

## 2023-10-24 ENCOUNTER — Encounter (INDEPENDENT_AMBULATORY_CARE_PROVIDER_SITE_OTHER): Payer: Self-pay
